# Patient Record
Sex: Male | Born: 1995 | Race: White | Hispanic: No | Marital: Single | Smoking: Never smoker
Health system: Southern US, Community
[De-identification: ages and names within clinical notes are randomized; demographics above are authoritative.]

---
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
## 2017-01-02 ENCOUNTER — Ambulatory Visit: Payer: Commercial Managed Care - PPO | Admitting: Sports Medicine

## 2017-02-18 DIAGNOSIS — Z713 Dietary counseling and surveillance: Secondary | ICD-10-CM | POA: Diagnosis not present

## 2017-02-18 DIAGNOSIS — Z0000 Encounter for general adult medical examination without abnormal findings: Secondary | ICD-10-CM | POA: Diagnosis not present

## 2023-08-07 ENCOUNTER — Ambulatory Visit (INDEPENDENT_AMBULATORY_CARE_PROVIDER_SITE_OTHER): Payer: BC Managed Care – PPO | Admitting: Family Medicine

## 2023-08-07 VITALS — BP 130/63 | Ht 70.0 in | Wt 165.0 lb

## 2023-08-07 DIAGNOSIS — M79672 Pain in left foot: Secondary | ICD-10-CM | POA: Diagnosis not present

## 2023-08-07 MED ORDER — MELOXICAM 15 MG PO TABS: 15.0000 mg | ORAL_TABLET | Freq: Every day | ORAL | 1 refills | Status: AC

## 2023-08-07 NOTE — Patient Instructions (Signed)
You strained your peroneus brevis tendon. Icing 15 minutes at a time as needed. Do home exercises daily for the next 4-6 weeks. Meloxicam 15mg  daily with food for pain and inflammation - take for 7-10 days then as needed. Don't take aleve or ibuprofen while on this. Consider arch supports (dr. Jari Sportsman active series, spencos, superfeet). Generally follow up out there in 1 month to 6 weeks. Have a safe trip!

## 2023-08-07 NOTE — Progress Notes (Signed)
PCP: Ermalinda Barrios, MD  Subjective:   HPI: Patient is a 27 y.o. male here for foot injury after running.  Was running on the pavement and then transitioned to trail running at which point he felt a sharp pain on his left lateral foot and ankle.  No known trauma.  He did recently trip and fall, but he cannot remember if this was before or after the injury.  No swelling or bruising of the ankle or foot.  No pain at rest, though is painful with prolonged weightbearing.  He has not been running much since this.  Has been taking OTC analgesics.  Does have a history of metatarsalgia which he thinks could be contributing.  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  No past medical history on file.  Current Outpatient Medications on File Prior to Visit  Medication Sig Dispense Refill   Dextromethorphan-Guaifenesin (MUCINEX DM MAXIMUM STRENGTH) 60-1200 MG TB12 Take 1 tablet by mouth every 12 (twelve) hours. 14 each 1   oseltamivir (TAMIFLU) 75 MG capsule TAKE 1 CAPSULE BY MOUTH TWICE A DAY FOR 5 DAYS  0   Promethazine-Phenyleph-Codeine 6.25-5-10 MG/5ML SYRP Take 5 mLs by mouth every 4 (four) hours as needed. 120 mL 0   No current facility-administered medications on file prior to visit.    Allergies  Allergen Reactions   Penicillins    Sulfa Antibiotics    Versed [Midazolam]     BP 130/63   Ht 5\' 10"  (1.778 m)   Wt 165 lb (74.8 kg)   BMI 23.68 kg/m       No data to display              No data to display              Objective:  Physical Exam:  Gen: NAD, comfortable in exam room  Left ankle: No gross deformity, swelling, ecchymoses Full range of motion.  Normal strength all motions including plantarflexion and external rotation. Mild tenderness to palpation over lateral surface of the foot at the base of the fifth metatarsal Negative ant drawer and negative talar tilt.   NV intact distally.    Limited ultrasound left foot/ankle: Evidence of anechoic fluid around distal peroneus brevis tendon without findings of stress fracture at base of fifth metatarsal.  Peroneal tendons appear intact.   Assessment & Plan:  1.  Left peroneus brevis strain: Recommended meloxicam, icing, home exercises, and rest from running.  Consider over-the-counter arch supports.  Recommended following up back at home PCP/sports med in 4 to 6 weeks.  Janeal Holmes, MD PGY-2, [facility]
